# Patient Record
Sex: Male | Born: 1980 | Race: Black or African American | Hispanic: No | Marital: Single | Smoking: Never smoker
Health system: Southern US, Community
[De-identification: ages and names within clinical notes are randomized; demographics above are authoritative.]

---

## 2019-10-01 LAB — LIPID PANEL
Chol/HDL Ratio: 2.7 (ref 0.0–4.4)
Cholesterol: 202 mg/dL — ABNORMAL HIGH (ref 100–200)
HDL: 75 mg/dL — ABNORMAL HIGH (ref 55–72)
LDL Cholesterol: 108 mg/dL — ABNORMAL HIGH (ref 0.0–100.0)
LDL/HDL Ratio: 1.4
Triglycerides: 93 mg/dL (ref 0–149)
VLDL: 18.6 mg/dL (ref 5.0–40.0)

## 2019-10-01 LAB — HIV 1/2 AG/AB COMBO: HIV Antigen Antibody: NONREACTIVE

## 2019-10-01 LAB — GLUCOSE, FASTING: Glucose, Fasting: 96 mg/dL (ref 70–99)

## 2020-04-14 ENCOUNTER — Other Ambulatory Visit: Payer: Self-pay | Admitting: Family Medicine

## 2020-04-14 ENCOUNTER — Other Ambulatory Visit (HOSPITAL_COMMUNITY): Payer: Self-pay | Admitting: Family Medicine

## 2020-04-14 DIAGNOSIS — R220 Localized swelling, mass and lump, head: Secondary | ICD-10-CM

## 2020-04-20 ENCOUNTER — Other Ambulatory Visit: Payer: Self-pay

## 2020-04-20 ENCOUNTER — Ambulatory Visit
Admission: RE | Admit: 2020-04-20 | Discharge: 2020-04-20 | Disposition: A | Discharge status: Home / Self Care | Payer: 59 | Source: Ambulatory Visit | Attending: Family Medicine | Admitting: Family Medicine

## 2020-04-20 DIAGNOSIS — R220 Localized swelling, mass and lump, head: Secondary | ICD-10-CM | POA: Insufficient documentation

## 2021-02-21 ENCOUNTER — Encounter (HOSPITAL_COMMUNITY): Payer: Self-pay

## 2021-02-21 ENCOUNTER — Ambulatory Visit (HOSPITAL_COMMUNITY)
Admission: EM | Admit: 2021-02-21 | Discharge: 2021-02-21 | Disposition: A | Discharge status: Home / Self Care | Payer: 59

## 2021-02-21 ENCOUNTER — Other Ambulatory Visit: Payer: Self-pay

## 2021-02-21 DIAGNOSIS — R208 Other disturbances of skin sensation: Secondary | ICD-10-CM | POA: Diagnosis not present

## 2021-02-21 NOTE — ED Provider Notes (Signed)
[facility]    CSN: [id number] Arrival date & time: 02/21/21  1332      History   Chief Complaint Chief Complaint  Patient presents with  . Numbness    HPI Russell Allen is a 40 y.o. male.   HPI  Numbness: Pt reports that he has had numbness of his left pinky finger. Per pt this started around 01/25/2021 after having his 2nd COVID-19 vaccine. He states that initially the pinky finger felt cold (perception not to touch) in nature and had a burning sensation from his upper arm extending down to his left pinky finger. This has improved but now he still has this sensation from his lateral wrist extending down to his pinky finger of the left hand. He has not tried anything for pain. He reports no edema, skin color changes, changes in palpation of warmth. He states that his friends and family have encouraged him to get this checked out today as it is still occurring for the past month- no new changes.    History reviewed. No pertinent past medical history.  There are no problems to display for this patient.   History reviewed. No pertinent surgical history.     Home Medications    Prior to Admission medications   Not on File    Family History History reviewed. No pertinent family history.  Social History Social History   Tobacco Use  . Smoking status: Never Smoker  . Smokeless tobacco: Never Used  Substance Use Topics  . Alcohol use: Yes  . Drug use: Never     Allergies   Patient has no known allergies.   Review of Systems Review of Systems  As stated above in HPI Physical Exam Triage Vital Signs ED Triage Vitals [02/21/21 1356]  Enc Vitals Group     BP 115/90     Pulse Rate 63     Resp 18     Temp 97.7 F (36.5 C)     Temp Source Oral     SpO2 99 %     Weight      Height      Head Circumference      Peak Flow      Pain Score      Pain Loc      Pain Edu?      Excl. in GC?    No data found.  Updated Vital Signs BP 115/90 (BP  Location: Right Arm)   Pulse 63   Temp 97.7 F (36.5 C) (Oral)   Resp 18   SpO2 99%   Physical Exam Vitals and nursing note reviewed.  Constitutional:      General: He is not in acute distress.    Appearance: Normal appearance. He is not ill-appearing, toxic-appearing or diaphoretic.  HENT:     Head: Normocephalic.  Cardiovascular:     Pulses: Normal pulses.  Musculoskeletal:        General: No swelling, tenderness, deformity or signs of injury. Normal range of motion.  Skin:    General: Skin is warm.     Coloration: Skin is not pale.     Findings: No erythema, lesion or rash.  Neurological:     General: No focal deficit present.     Mental Status: He is alert.     Sensory: No sensory deficit.     Motor: No weakness.     Coordination: Coordination normal.      UC Treatments / Results  Labs (all labs ordered  are listed, but only abnormal results are displayed) Labs Reviewed - No data to display  EKG   Radiology No results found.  Procedures Procedures (including critical care time)  Medications Ordered in UC Medications - No data to display  Initial Impression / Assessment and Plan / UC Course  I have reviewed the triage vital signs and the nursing notes.  Pertinent labs & imaging results that were available during my care of the patient were reviewed by me and considered in my medical decision making (see chart for details).     New. Normal examination. Likely irritation of the nerve from inflammation secondary to the vaccine or other. Happy to hear this is improving- discussed that this may take many more months to fully resolve- potentially may not resolve fully. Encouraged hydration with water, multivitamin, healthy diet- all things to help promote nerve health. Discussed red flag signs and symptoms Final Clinical Impressions(s) / UC Diagnoses   Final diagnoses:  None   Discharge Instructions   None    ED Prescriptions    None     PDMP not  reviewed this encounter.   Rushie Chestnut, [place] 02/21/21 1424

## 2021-02-21 NOTE — Discharge Instructions (Addendum)
Take a multivitamin and make sure to stay well hydrated with water and eat a healthy balanced diet to help with healing

## 2021-02-21 NOTE — ED Triage Notes (Signed)
Pt presents with numbness in the left pinky finger after having the 2nd COVID vaccine on 01/25/2021.

## 2021-05-08 IMAGING — US US SOFT TISSUE HEAD/NECK
1 series · 14 of 14 positions shown · non-contrast
Comparison: None.

CLINICAL DATA: 38-year-old male with a scalp mass

EXAM:
ULTRASOUND OF HEAD/NECK SOFT TISSUES
TECHNIQUE: Ultrasound examination of the head and neck soft tissues was
performed in the area of clinical concern.

[Series 1: us soft tissue head & neck (non-thyroid) · 14 acquisitions, 14 frames shown]
[im 1/14]
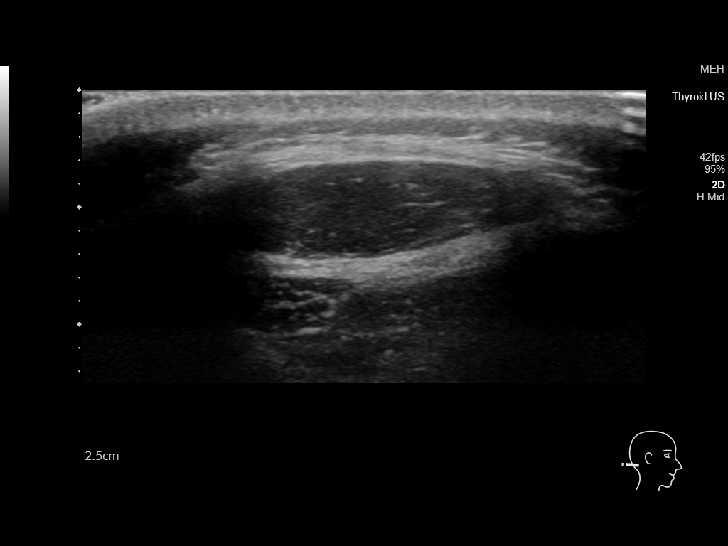
[im 2/14]
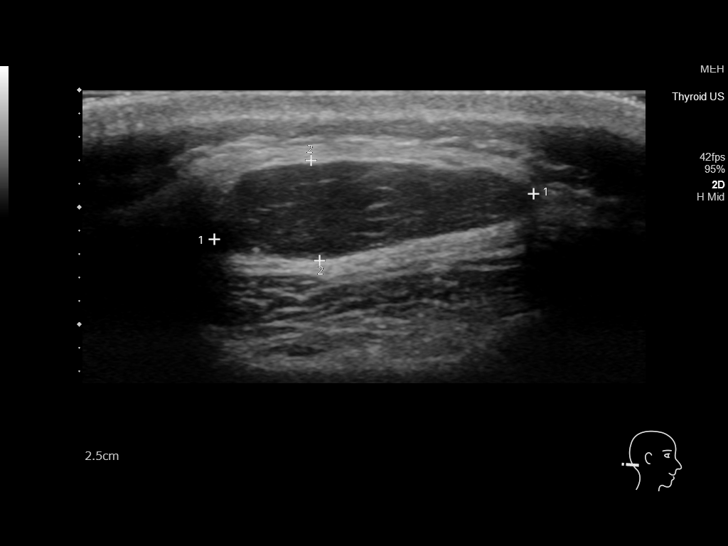
[im 3/14]
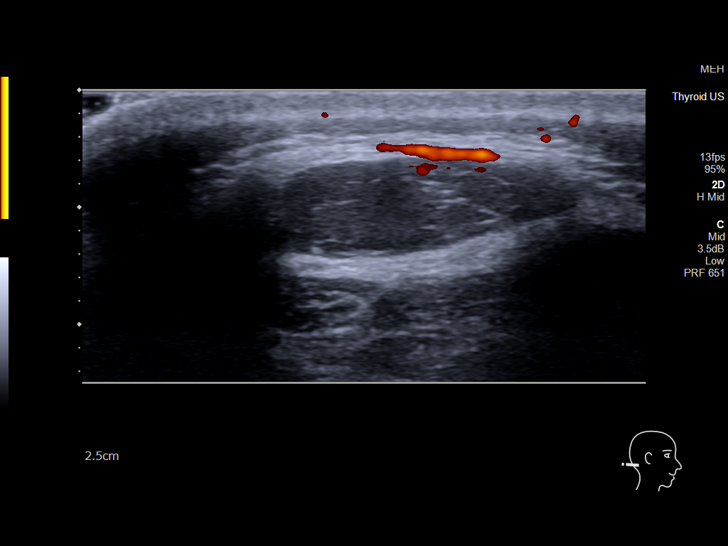
[im 4/14]
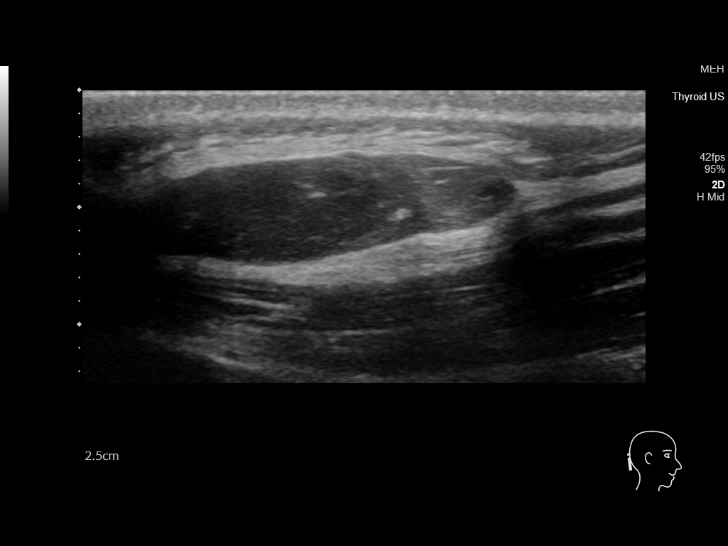
[im 5/14]
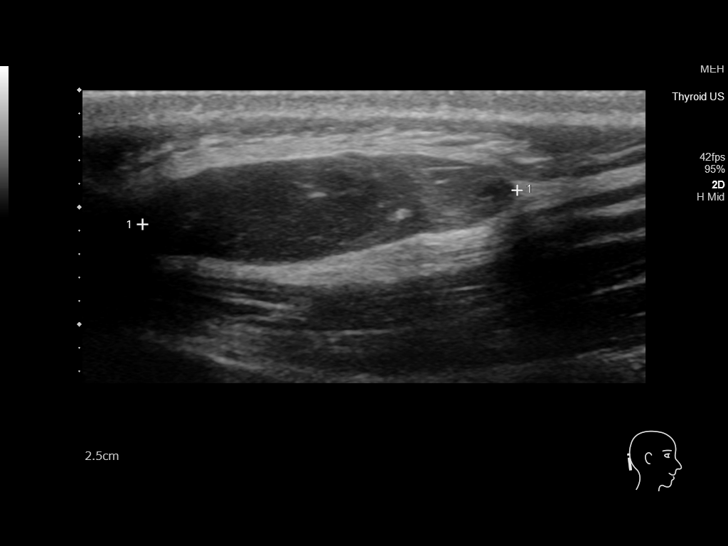
[im 6/14]
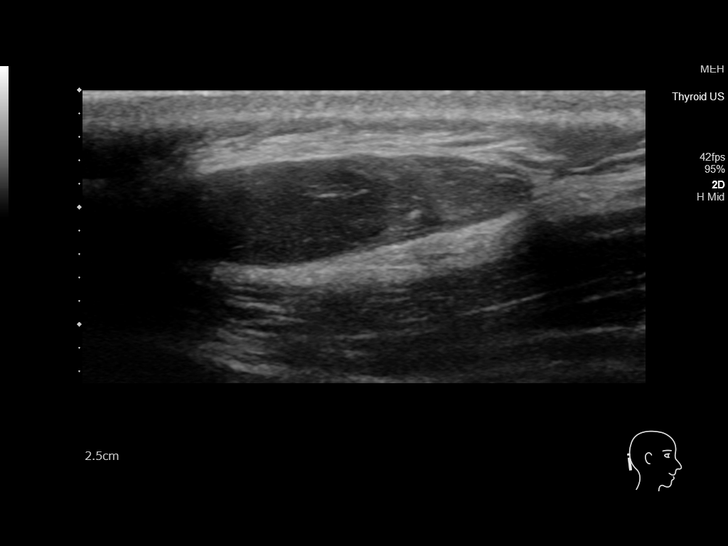
[im 7/14]
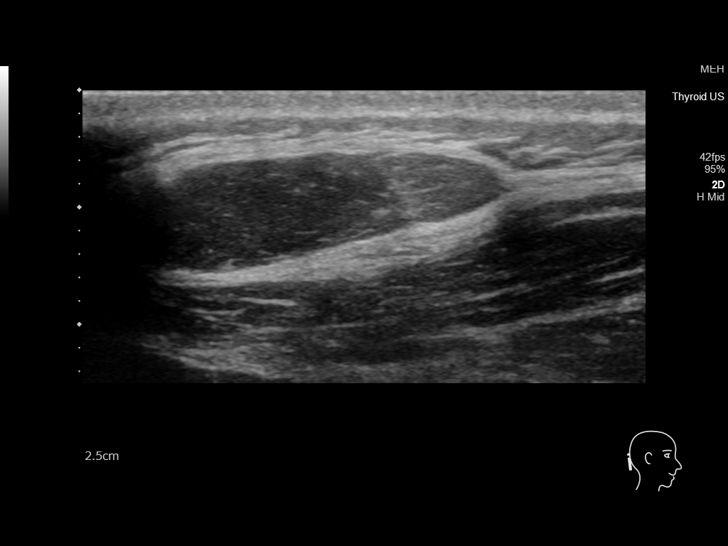
[im 8/14]
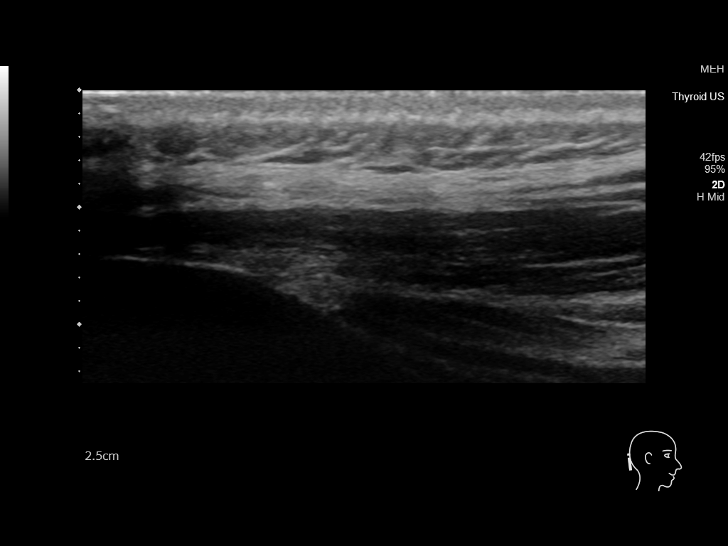
[im 9/14]
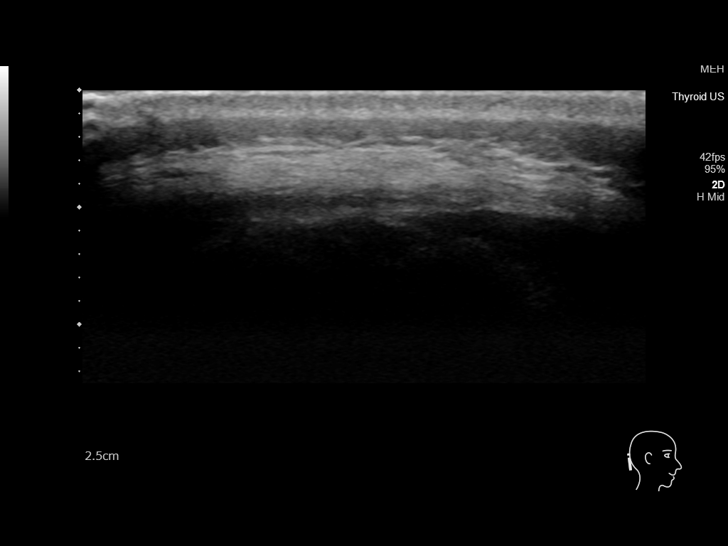
[im 10/14]
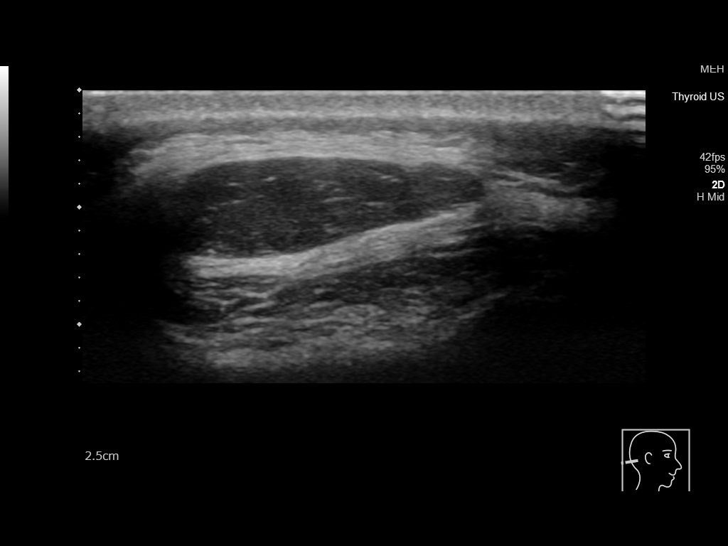
[im 11/14]
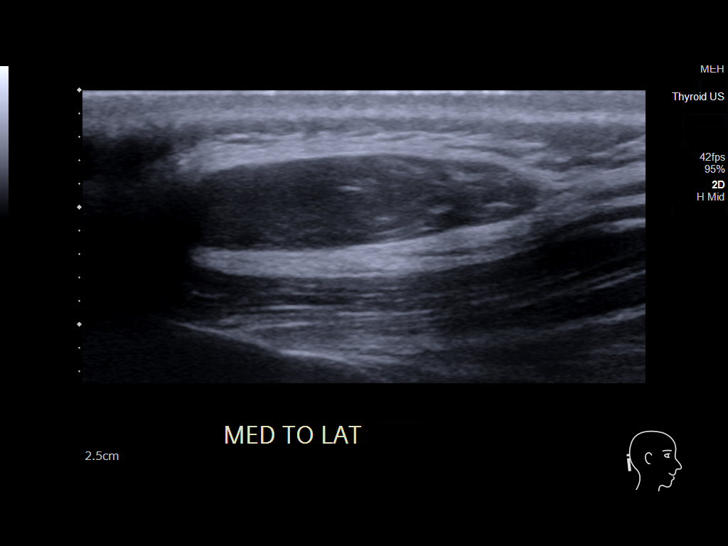
[im 12/14]
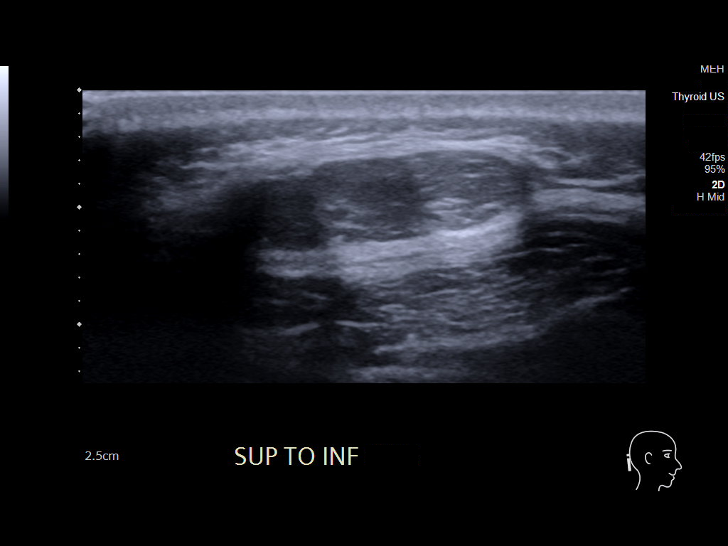
[im 13/14]
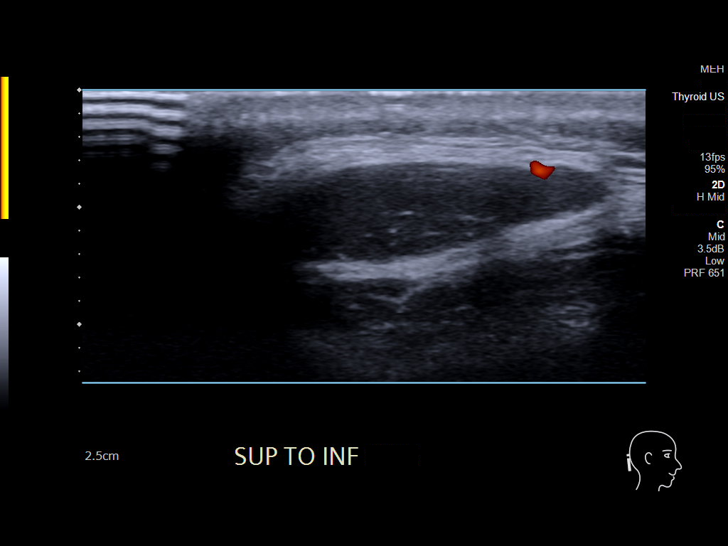
[im 14/14]
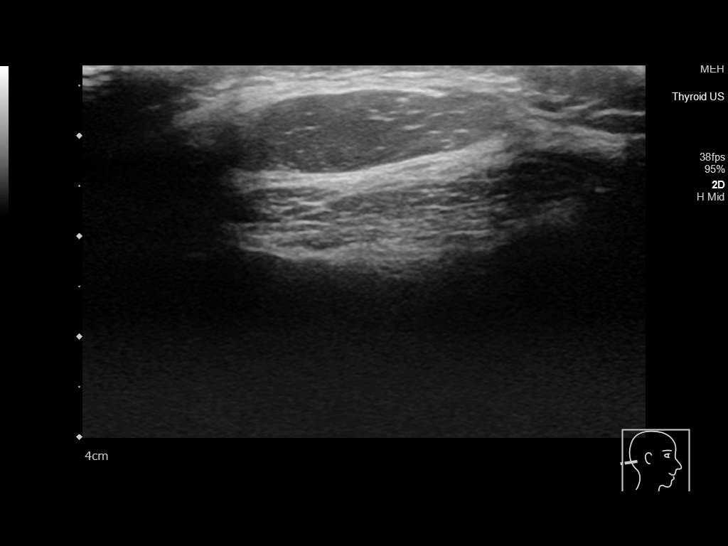

[14 of 14 positions shown; findings below may reference images not displayed]

FINDINGS: Grayscale and color duplex performed in the region of clinical
concern.

Within the posterior scalp superficial soft tissues there is an
ovoid heterogeneously hypoechoic soft tissue lesion with no cystic
component and no internal color flow. Margins are well-defined and
the diameters are estimated 3.2 cm x 2.8 cm x 0.9 cm.

No adenopathy.
IMPRESSION: Sonographic survey demonstrates a superficial soft tissue lesion in
the posterior scalp. Leading differential diagnosis would be lipoma,
however, low-grade liposarcoma cannot be excluded with ultrasound.

## 2021-10-12 ENCOUNTER — Other Ambulatory Visit: Payer: Self-pay

## 2021-10-12 ENCOUNTER — Emergency Department (HOSPITAL_COMMUNITY)
Admission: EM | Admit: 2021-10-12 | Discharge: 2021-10-12 | Disposition: A | Discharge status: Home / Self Care | Payer: 59 | Attending: Emergency Medicine | Admitting: Emergency Medicine

## 2021-10-12 DIAGNOSIS — Z5321 Procedure and treatment not carried out due to patient leaving prior to being seen by health care provider: Secondary | ICD-10-CM | POA: Diagnosis not present

## 2021-10-12 DIAGNOSIS — R42 Dizziness and giddiness: Secondary | ICD-10-CM | POA: Diagnosis not present

## 2021-10-12 DIAGNOSIS — R519 Headache, unspecified: Secondary | ICD-10-CM | POA: Insufficient documentation

## 2021-10-12 MED ORDER — ACETAMINOPHEN 325 MG PO TABS: 650.0000 mg | ORAL_TABLET | Freq: Once | ORAL | Status: DC

## 2021-10-12 NOTE — ED Provider Notes (Signed)
Emergency Medicine Provider Triage Evaluation Note  Russell Allen , a 40 y.o. male  was evaluated in triage.  Pt complains of headache x4 weeks.  Came on gradually, went to urgent care for the same was prescribed ibuprofen and took 1 dose and it did not help.  He has not taken anything else for this.  Reported some dizziness this morning, but this is subsided.  No aggravating or alleviating factors for his symptoms.  No recent injury or illness.  Review of Systems  Positive: Headache, dizziness Negative: Lightheadedness, syncope, cough, congestion, fever, vision changes  Physical Exam  BP 110/82   Pulse 60   Temp 98.4 F (36.9 C) (Oral)   Resp 14   SpO2 100%  Gen:   Awake, no distress   Resp:  Normal effort  MSK:   Moves extremities without difficulty  Other:  5/5 strength in all extremities, sensation intact, PERRLA, EOMI   Medical Decision Making  Medically screening exam initiated at 1:44 PM.  Appropriate orders placed.  Russell Allen was informed that the remainder of the evaluation will be completed by another provider, this initial triage assessment does not replace that evaluation, and the importance of remaining in the ED until their evaluation is complete.     Jeanella Flattery 10/12/21 1345    Margarita Grizzle, MD 10/14/21 1104

## 2021-10-12 NOTE — ED Notes (Signed)
Pt called x1 for vitals with no response 

## 2021-10-12 NOTE — ED Triage Notes (Signed)
Pt with headache x 4 weeks. Went to UC for same and was prescribed ibuprofen and took one dose and it didn't help. No other OTC meds.

## 2021-10-24 ENCOUNTER — Encounter: Attending: Family Medicine | Primary: Diagnostic Radiology

## 2021-10-24 NOTE — Telephone Encounter (Signed)
Hello, the call center schedule this patient name Scott Porter, Scott Porter for a VV I explain to him we can not do vv outside the stale of [place]. pt refuse to listen and wants a MRI referral sent to [place] I told him that I wold have my office manager call him.

## 2021-11-18 NOTE — Telephone Encounter (Signed)
Legally, we are not allowed to do a virtual visit if the patient is not in [place]. As far as the MRI order, per the doctor, he is not comfortable ordering this test since he has not seen this patient.
